# Patient Record
Sex: Female | Born: 2000 | Race: Black or African American | Hispanic: No | Marital: Single | Smoking: Never smoker
Health system: Southern US, Community
[De-identification: ages and names within clinical notes are randomized; demographics above are authoritative.]

## PROBLEM LIST (undated history)

## (undated) DIAGNOSIS — R5383 Other fatigue: Secondary | ICD-10-CM

## (undated) DIAGNOSIS — I10 Essential (primary) hypertension: Secondary | ICD-10-CM

## (undated) DIAGNOSIS — G90A Postural orthostatic tachycardia syndrome (POTS): Secondary | ICD-10-CM

## (undated) DIAGNOSIS — G43909 Migraine, unspecified, not intractable, without status migrainosus: Secondary | ICD-10-CM

## (undated) HISTORY — PX: HERNIA REPAIR: SHX51

## (undated) HISTORY — PX: TONSILLECTOMY: SUR1361

---

## 2020-01-12 ENCOUNTER — Other Ambulatory Visit: Payer: Self-pay

## 2020-01-13 ENCOUNTER — Other Ambulatory Visit: Payer: Self-pay

## 2020-08-31 ENCOUNTER — Emergency Department (HOSPITAL_COMMUNITY)
Admission: EM | Admit: 2020-08-31 | Discharge: 2020-08-31 | Disposition: A | Discharge status: Home / Self Care | Payer: 59 | Attending: Emergency Medicine | Admitting: Emergency Medicine

## 2020-08-31 ENCOUNTER — Emergency Department (HOSPITAL_COMMUNITY): Payer: 59

## 2020-08-31 ENCOUNTER — Encounter (HOSPITAL_COMMUNITY): Payer: Self-pay | Admitting: Emergency Medicine

## 2020-08-31 DIAGNOSIS — Y92410 Unspecified street and highway as the place of occurrence of the external cause: Secondary | ICD-10-CM | POA: Diagnosis not present

## 2020-08-31 DIAGNOSIS — S0990XA Unspecified injury of head, initial encounter: Secondary | ICD-10-CM | POA: Diagnosis present

## 2020-08-31 DIAGNOSIS — S0081XA Abrasion of other part of head, initial encounter: Secondary | ICD-10-CM | POA: Insufficient documentation

## 2020-08-31 MED ORDER — METHOCARBAMOL 500 MG PO TABS: 500.0000 mg | ORAL_TABLET | Freq: Two times a day (BID) | ORAL | 0 refills | Status: AC | PRN
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
## 2021-02-23 ENCOUNTER — Emergency Department (HOSPITAL_COMMUNITY)
Admission: EM | Admit: 2021-02-23 | Discharge: 2021-02-23 | Disposition: A | Discharge status: Home / Self Care | Payer: 59 | Attending: Emergency Medicine | Admitting: Emergency Medicine

## 2021-02-23 DIAGNOSIS — R531 Weakness: Secondary | ICD-10-CM | POA: Diagnosis present

## 2021-02-23 DIAGNOSIS — Z5321 Procedure and treatment not carried out due to patient leaving prior to being seen by health care provider: Secondary | ICD-10-CM | POA: Diagnosis not present

## 2021-02-23 DIAGNOSIS — R5383 Other fatigue: Secondary | ICD-10-CM | POA: Insufficient documentation

## 2021-02-23 DIAGNOSIS — R42 Dizziness and giddiness: Secondary | ICD-10-CM | POA: Insufficient documentation

## 2021-02-23 LAB — URINALYSIS, ROUTINE W REFLEX MICROSCOPIC
Bilirubin Urine: NEGATIVE
Glucose, UA: NEGATIVE mg/dL
Hgb urine dipstick: NEGATIVE
Ketones, ur: NEGATIVE mg/dL
Leukocytes,Ua: NEGATIVE
Nitrite: NEGATIVE
Protein, ur: NEGATIVE mg/dL
Specific Gravity, Urine: 1.025 (ref 1.005–1.030)
pH: 6 (ref 5.0–8.0)

## 2021-02-23 LAB — BASIC METABOLIC PANEL
Anion gap: 8 (ref 5–15)
BUN: 8 mg/dL (ref 6–20)
CO2: 23 mmol/L (ref 22–32)
Calcium: 9.1 mg/dL (ref 8.9–10.3)
Chloride: 107 mmol/L (ref 98–111)
Creatinine, Ser: 0.62 mg/dL (ref 0.44–1.00)
GFR, Estimated: >60 mL/min (ref >60)
Glucose, Bld: 101 mg/dL — ABNORMAL HIGH (ref 70–99)
Potassium: 3.7 mmol/L (ref 3.5–5.1)
Sodium: 138 mmol/L (ref 135–145)

## 2021-02-23 LAB — CBC
HCT: 39.2 % (ref 36.0–46.0)
Hemoglobin: 13.3 g/dL (ref 12.0–15.0)
MCH: 31.7 pg (ref 26.0–34.0)
MCHC: 33.9 g/dL (ref 30.0–36.0)
MCV: 93.3 fL (ref 80.0–100.0)
Platelets: 238 10*3/uL (ref 150–400)
RBC: 4.2 MIL/uL (ref 3.87–5.11)
RDW: 12.1 % (ref 11.5–15.5)
WBC: 6.5 10*3/uL (ref 4.0–10.5)
nRBC: 0 % (ref 0.0–0.2)

## 2021-02-23 LAB — CBG MONITORING, ED: Glucose-Capillary: 95 mg/dL (ref 70–99)

## 2021-02-23 LAB — I-STAT BETA HCG BLOOD, ED (MC, WL, AP ONLY): I-stat hCG, quantitative: <5 m[IU]/mL (ref <5)

## 2021-02-23 NOTE — ED Triage Notes (Signed)
Pt c/o consistent weakness/dizziness x2wks. Endorses hx of POTS, usually well controlled w hydration. States liquid IV works at home, but "it hasn't put a dent in it this time." Denies syncopal episodes, "I'm able to sit down when I feel faint." States she came to ED for IV infusion "to get it under control."

## 2021-02-23 NOTE — ED Notes (Signed)
Pt did not want to wait longer and left.

## 2021-02-23 NOTE — ED Provider Triage Note (Signed)
Emergency Medicine Provider Triage Evaluation Note  Nancy Shaw , a 21 y.o. female  was evaluated in triage.  Pt complains of weakness and fatigue.  History of POTS, states that same has been an ongoing issue for some time but has been acutely worsening the last 2 weeks.  States she is she is normally able to manage with liquid IV and plenty of oral hydration but nothing she tries is giving her any relief this time.  States that she has no energy and is barely able to get out of bed every day.  States that this is standard for her normal POTS flares which are normally resolved with IV fluids.  Review of Systems  Positive: Weakness, fatigue Negative: Fever, chills, chest pain, shortness of breath, nausea, vomiting  Physical Exam  BP (!) 149/80 (BP Location: Right Arm)    Pulse 96    Temp 99.3 F (37.4 C) (Oral)    Resp 20    SpO2 99%  Gen:   Awake, no distress   Resp:  Normal effort  MSK:   Moves extremities without difficulty Other:    Medical Decision Making  Medically screening exam initiated at 5:43 PM.  Appropriate orders placed.  Nancy Shaw was informed that the remainder of the evaluation will be completed by another provider, this initial triage assessment does not replace that evaluation, and the importance of remaining in the ED until their evaluation is complete.     Silva Bandy, PA-C 02/23/21 1745

## 2021-03-31 ENCOUNTER — Encounter (HOSPITAL_COMMUNITY): Payer: Self-pay

## 2021-03-31 ENCOUNTER — Ambulatory Visit (HOSPITAL_COMMUNITY)
Admission: EM | Admit: 2021-03-31 | Discharge: 2021-03-31 | Disposition: A | Discharge status: Home / Self Care | Payer: 59 | Attending: Internal Medicine | Admitting: Internal Medicine

## 2021-03-31 DIAGNOSIS — R590 Localized enlarged lymph nodes: Secondary | ICD-10-CM | POA: Diagnosis present

## 2021-03-31 DIAGNOSIS — H65191 Other acute nonsuppurative otitis media, right ear: Secondary | ICD-10-CM

## 2021-03-31 LAB — POCT RAPID STREP A, ED / UC: Streptococcus, Group A Screen (Direct): NEGATIVE

## 2021-03-31 LAB — POCT INFECTIOUS MONO SCREEN, ED / UC: Mono Screen: NEGATIVE

## 2021-03-31 MED ORDER — AMOXICILLIN 875 MG PO TABS: 875.0000 mg | ORAL_TABLET | Freq: Two times a day (BID) | ORAL | 0 refills | Status: DC

## 2021-03-31 NOTE — ED Provider Notes (Signed)
?[facility] ? ? ? ?CSN: [id number] ?Arrival date & time: 03/31/21  1731 ? ? ?  ? ?History   ?Chief Complaint ?No chief complaint on file. ? ? ?HPI ?Nancy Shaw is a 21 y.o. female.  ? ?Patient presents with lump to right side of neck that she noticed today.  Denies any pain to the area.  Denies any associated upper respiratory symptoms, sore throat, ear pain, fever.  Denies any known sick contacts.  Denies any injury to the area.  Patient has full range of motion of neck.  Denies any drainage from the lesion. ? ? ? ?History reviewed. No pertinent past medical history. ? ?There are no problems to display for this patient. ? ? ?History reviewed. No pertinent surgical history. ? ?OB History   ?No obstetric history on file. ?  ? ? ? ?Home Medications   ? ?Prior to Admission medications   ?Medication Sig Start Date End Date Taking? Authorizing Provider  ?amoxicillin (AMOXIL) 875 MG tablet Take 1 tablet (875 mg total) by mouth 2 (two) times daily for 10 days. 03/31/21 04/10/21 Yes Gustavus Bryant, FNP  ?methocarbamol (ROBAXIN) 500 MG tablet Take 1 tablet (500 mg total) by mouth 2 (two) times daily as needed for muscle spasms. 08/31/20   Farrel Gordon, PA-C  ? ? ?Family History ?History reviewed. No pertinent family history. ? ?Social History ?Social History  ? ?Tobacco Use  ? Smoking status: Never  ? Smokeless tobacco: Never  ? ? ? ?Allergies   ?Patient has no allergy information on record. ? ? ?Review of Systems ?Review of Systems ?Per HPI ? ?Physical Exam ?Triage Vital Signs ?ED Triage Vitals [03/31/21 1828]  ?Enc Vitals Group  ?   BP 127/76  ?   Pulse Rate 95  ?   Resp 16  ?   Temp 98 ?F (36.7 ?C)  ?   Temp Source Oral  ?   SpO2 99 %  ?   Weight   ?   Height   ?   Head Circumference   ?   Peak Flow   ?   Pain Score   ?   Pain Loc   ?   Pain Edu?   ?   Excl. in GC?   ? ?No data found. ? ?Updated Vital Signs ?BP 127/76 (BP Location: Left Arm)   Pulse 95   Temp 98 ?F (36.7 ?C) (Oral)   Resp 16   SpO2 99%   ? ?Visual Acuity ?Right Eye Distance:   ?Left Eye Distance:   ?Bilateral Distance:   ? ?Right Eye Near:   ?Left Eye Near:    ?Bilateral Near:    ? ?Physical Exam ?Constitutional:   ?   General: She is not in acute distress. ?   Appearance: Normal appearance. She is not toxic-appearing or diaphoretic.  ?HENT:  ?   Head: Normocephalic and atraumatic.  ?   Right Ear: Ear canal and external ear normal. No drainage, swelling or tenderness. No middle ear effusion. No mastoid tenderness. Tympanic membrane is erythematous. Tympanic membrane is not perforated or bulging.  ?   Left Ear: Tympanic membrane and ear canal normal.  ?   Nose: Nose normal.  ?   Mouth/Throat:  ?   Mouth: Mucous membranes are moist.  ?   Pharynx: No posterior oropharyngeal erythema.  ?Eyes:  ?   Extraocular Movements: Extraocular movements intact.  ?   Conjunctiva/sclera: Conjunctivae normal.  ?Cardiovascular:  ?   Rate  and Rhythm: Normal rate and regular rhythm.  ?   Pulses: Normal pulses.  ?   Heart sounds: Normal heart sounds.  ?Pulmonary:  ?   Effort: Pulmonary effort is normal. No respiratory distress.  ?   Breath sounds: Normal breath sounds.  ?Lymphadenopathy:  ?   Cervical: Cervical adenopathy present.  ?   Right cervical: Superficial cervical adenopathy present.  ?Skin: ?   General: Skin is warm and dry.  ?Neurological:  ?   General: No focal deficit present.  ?   Mental Status: She is alert and oriented to person, place, and time. Mental status is at baseline.  ?Psychiatric:     ?   Mood and Affect: Mood normal.     ?   Behavior: Behavior normal.     ?   Thought Content: Thought content normal.     ?   Judgment: Judgment normal.  ? ? ? ?UC Treatments / Results  ?Labs ?(all labs ordered are listed, but only abnormal results are displayed) ?Labs Reviewed  ?CULTURE, GROUP A STREP [facility])  ?POCT RAPID STREP A, ED / UC  ?POCT INFECTIOUS MONO SCREEN, ED / UC  ? ? ?EKG ? ? ?Radiology ?No results found. ? ?Procedures ?Procedures (including critical  care time) ? ?Medications Ordered in UC ?Medications - No data to display ? ?Initial Impression / Assessment and Plan / UC Course  ?I have reviewed the triage vital signs and the nursing notes. ? ?Pertinent labs & imaging results that were available during my care of the patient were reviewed by me and considered in my medical decision making (see chart for details). ? ?  ? ?Patient has right otitis media which is most likely cause of patient's cervical lymph node swelling.  Rapid strep was negative.  Rapid mono was negative.  Throat culture is pending.  Amoxicillin to treat right ear infection.  Patient to monitor and follow-up if symptoms persist or worsen.  Discussed return precautions.  Patient verbalized understanding and was agreeable with plan. ?Final Clinical Impressions(s) / UC Diagnoses  ? ?Final diagnoses:  ?Other non-recurrent acute nonsuppurative otitis media of right ear  ?Cervical lymphadenopathy  ? ? ? ?Discharge Instructions   ? ?  ?Your rapid strep test and rapid monotest were negative.  You have an ear infection which is most likely the cause of your lymph node swelling.  An antibiotic has been prescribed to help treat this.  Please follow-up if symptoms persist or worsen. ? ? ? ? ?ED Prescriptions   ? ? Medication Sig Dispense Auth. Provider  ? amoxicillin (AMOXIL) 875 MG tablet Take 1 tablet (875 mg total) by mouth 2 (two) times daily for 10 days. 20 tablet Ervin Knack E, [place]  ? ?  ? ?PDMP not reviewed this encounter. ?  ?Gustavus Bryant, [place] ?03/31/21 4098 ? ?

## 2021-03-31 NOTE — Discharge Instructions (Signed)
Your rapid strep test and rapid monotest were negative.  You have an ear infection which is most likely the cause of your lymph node swelling.  An antibiotic has been prescribed to help treat this.  Please follow-up if symptoms persist or worsen. ?

## 2021-03-31 NOTE — ED Triage Notes (Signed)
Pt presents to the office for left neck bump. Pt states she woke up this morning and notice the bump. Pt does not report flu-like symptom. ?

## 2021-04-03 ENCOUNTER — Encounter (HOSPITAL_COMMUNITY): Payer: Self-pay

## 2021-04-03 ENCOUNTER — Ambulatory Visit (HOSPITAL_COMMUNITY)
Admission: EM | Admit: 2021-04-03 | Discharge: 2021-04-03 | Disposition: A | Discharge status: Home / Self Care | Payer: 59 | Attending: Emergency Medicine | Admitting: Emergency Medicine

## 2021-04-03 DIAGNOSIS — J029 Acute pharyngitis, unspecified: Secondary | ICD-10-CM | POA: Insufficient documentation

## 2021-04-03 DIAGNOSIS — R590 Localized enlarged lymph nodes: Secondary | ICD-10-CM | POA: Insufficient documentation

## 2021-04-03 DIAGNOSIS — R519 Headache, unspecified: Secondary | ICD-10-CM | POA: Insufficient documentation

## 2021-04-03 DIAGNOSIS — R5381 Other malaise: Secondary | ICD-10-CM | POA: Diagnosis not present

## 2021-04-03 DIAGNOSIS — H66001 Acute suppurative otitis media without spontaneous rupture of ear drum, right ear: Secondary | ICD-10-CM | POA: Insufficient documentation

## 2021-04-03 LAB — CBC WITH DIFFERENTIAL/PLATELET
Abs Immature Granulocytes: 0.06 10*3/uL (ref 0.00–0.07)
Basophils Absolute: 0 10*3/uL (ref 0.0–0.1)
Basophils Relative: 0 %
Eosinophils Absolute: 0.1 10*3/uL (ref 0.0–0.5)
Eosinophils Relative: 1 %
HCT: 39.6 % (ref 36.0–46.0)
Hemoglobin: 13.6 g/dL (ref 12.0–15.0)
Immature Granulocytes: 1 %
Lymphocytes Relative: 25 %
Lymphs Abs: 1.8 10*3/uL (ref 0.7–4.0)
MCH: 32.2 pg (ref 26.0–34.0)
MCHC: 34.3 g/dL (ref 30.0–36.0)
MCV: 93.6 fL (ref 80.0–100.0)
Monocytes Absolute: 0.5 10*3/uL (ref 0.1–1.0)
Monocytes Relative: 6 %
Neutro Abs: 4.8 10*3/uL (ref 1.7–7.7)
Neutrophils Relative %: 67 %
Platelets: 236 10*3/uL (ref 150–400)
RBC: 4.23 MIL/uL (ref 3.87–5.11)
RDW: 11.9 % (ref 11.5–15.5)
WBC: 7.2 10*3/uL (ref 4.0–10.5)
nRBC: 0 % (ref 0.0–0.2)

## 2021-04-03 LAB — CULTURE, GROUP A STREP (THRC)

## 2021-04-03 MED ORDER — CEFDINIR 300 MG PO CAPS
300.0000 mg | ORAL_CAPSULE | Freq: Two times a day (BID) | ORAL | 0 refills | Status: AC
Start: 2021-04-03 — End: 2021-04-13

## 2021-04-03 NOTE — Discharge Instructions (Signed)
For the infection in your right ear and enlarged lymph nodes, please discontinue amoxicillin and begin taking Omnicef.  Please take 1 capsule twice daily for the next 10 days. ? ?To explore other possible underlying causes for your enlarged lymph nodes, we obtained a complete blood cell count which looks at all of your red blood cells and all of your white blood cells for any signs of abnormal numbers are abnormal shapes and sizes.  We also obtained a more specific monotest: Epstein-Barr viral IgG/IgM.  The CBC results should be available in the next 24 to 48 hours however the second test could take up to 5 days.  We will notify you of all the results once they are complete. ? ?Thank you for returning to urgent care today for repeat evaluation.  In the next 3 to 5 days, if you have not had improvement of your symptoms or feel your symptoms are getting worse, I encourage you to consider going to the emergency room for further, more emergent evaluation. ?

## 2021-04-03 NOTE — ED Triage Notes (Signed)
Pt presents with increased HA, lumps under bilateral axilla, and c/o sunken eyes after last visit ?

## 2021-04-03 NOTE — ED Provider Notes (Signed)
?[facility] ? ? ? ?CSN: [id number] ?Arrival date & time: 04/03/21  1935 ?  ? ?HISTORY  ? ?Chief Complaint  ?Patient presents with  ? Headache  ? Mass  ?  Bilateral axilla  ? ?HPI ?Nancy Shaw is a 21 y.o. female. Patient presents to urgent care today for follow-up of lump on the right side of her neck and infection in her right ear.  Patient was seen here at urgent care on March 24 for the symptoms, was started on amoxicillin.  Patient states since she began amoxicillin she has begun to have lymph node swelling under both of her arms, has noticed increase in fatigue, darkening around her eyes and increase intensity and frequency of the headaches.  Patient states the lumps under her arms the fatigue, the darkening around her eyes and the headaches are new.  Patient also states that when she began taking amoxicillin she had a bright red patch that was itchy on her back which went away several hours after it appeared.  Patient states she is also noticed that her throat is become more scratchy.  Patient denies vision changes, dizziness, chest pain, shortness of breath. ? ?The history is provided by the patient.  ?History reviewed. No pertinent past medical history. ?There are no problems to display for this patient. ? ?History reviewed. No pertinent surgical history. ?OB History   ?No obstetric history on file. ?  ? ?Home Medications   ? ?Prior to Admission medications   ?Medication Sig Start Date End Date Taking? Authorizing Provider  ?cefdinir (OMNICEF) 300 MG capsule Take 1 capsule (300 mg total) by mouth 2 (two) times daily for 10 days. 04/03/21 04/13/21 Yes Theadora RamaMorgan, Keyah Blizard Scales, PA-C  ?methocarbamol (ROBAXIN) 500 MG tablet Take 1 tablet (500 mg total) by mouth 2 (two) times daily as needed for muscle spasms. 08/31/20   Farrel GordonPatel, Shalyn, PA-C  ? ?Family History ?History reviewed. No pertinent family history. ?Social History ?Social History  ? ?Tobacco Use  ? Smoking status: Never  ? Smokeless tobacco: Never   ? ?Allergies   ?Patient has no known allergies. ? ?Review of Systems ?Review of Systems ?Pertinent findings noted in history of present illness.  ? ?Physical Exam ?Triage Vital Signs ?ED Triage Vitals  ?Enc Vitals Group  ?   BP 11/04/20 0827 (!) 147/82  ?   Pulse Rate 11/04/20 0827 72  ?   Resp 11/04/20 0827 18  ?   Temp 11/04/20 0827 98.3 ?F (36.8 ?C)  ?   Temp Source 11/04/20 0827 Oral  ?   SpO2 11/04/20 0827 98 %  ?   Weight --   ?   Height --   ?   Head Circumference --   ?   Peak Flow --   ?   Pain Score 11/04/20 0826 5  ?   Pain Loc --   ?   Pain Edu? --   ?   Excl. in GC? --   ?No data found. ? ?Updated Vital Signs ?BP 126/79 (BP Location: Right Arm)   Pulse 90   Temp 98.8 ?F (37.1 ?C) (Oral)   Resp 14   SpO2 97%  ? ?Physical Exam ?Vitals and nursing note reviewed.  ?Constitutional:   ?   General: She is not in acute distress. ?   Appearance: Normal appearance. She is not ill-appearing.  ?HENT:  ?   Head: Normocephalic and atraumatic.  ?   Salivary Glands: Right salivary gland is not diffusely enlarged or  tender. Left salivary gland is not diffusely enlarged or tender.  ?   Right Ear: Ear canal and external ear normal. No drainage. A middle ear effusion (Suppurative fluid behind right TM) is present. There is no impacted cerumen. Tympanic membrane is injected and erythematous. Tympanic membrane is not bulging.  ?   Left Ear: Tympanic membrane, ear canal and external ear normal. No drainage.  No middle ear effusion. There is no impacted cerumen. Tympanic membrane is not erythematous or bulging.  ?   Nose: Nose normal. No nasal deformity, septal deviation, mucosal edema, congestion or rhinorrhea.  ?   Right Turbinates: Not enlarged, swollen or pale.  ?   Left Turbinates: Not enlarged, swollen or pale.  ?   Right Sinus: No maxillary sinus tenderness or frontal sinus tenderness.  ?   Left Sinus: No maxillary sinus tenderness or frontal sinus tenderness.  ?   Mouth/Throat:  ?   Lips: Pink. No lesions.  ?    Mouth: Mucous membranes are moist. No oral lesions.  ?   Pharynx: Oropharynx is clear. Uvula midline. Posterior oropharyngeal erythema present. No uvula swelling.  ?   Tonsils: No tonsillar exudate. 0 on the right. 0 on the left.  ?Eyes:  ?   General: Lids are normal.     ?   Right eye: No discharge.     ?   Left eye: No discharge.  ?   Extraocular Movements: Extraocular movements intact.  ?   Conjunctiva/sclera: Conjunctivae normal.  ?   Right eye: Right conjunctiva is not injected.  ?   Left eye: Left conjunctiva is not injected.  ?Neck:  ?   Trachea: Trachea and phonation normal.  ?Cardiovascular:  ?   Rate and Rhythm: Normal rate and regular rhythm.  ?   Pulses: Normal pulses.  ?   Heart sounds: Normal heart sounds. No murmur heard. ?  No friction rub. No gallop.  ?Pulmonary:  ?   Effort: Pulmonary effort is normal. No accessory muscle usage, prolonged expiration or respiratory distress.  ?   Breath sounds: Normal breath sounds. No stridor, decreased air movement or transmitted upper airway sounds. No decreased breath sounds, wheezing, rhonchi or rales.  ?Chest:  ?   Chest wall: No tenderness.  ?Musculoskeletal:     ?   General: Normal range of motion.  ?   Cervical back: Normal range of motion and neck supple. Normal range of motion.  ?Lymphadenopathy:  ?   Head:  ?   Right side of head: Submandibular, tonsillar and posterior auricular adenopathy present. No submental, preauricular or occipital adenopathy.  ?   Left side of head: Submandibular, tonsillar and posterior auricular adenopathy present. No submental, preauricular or occipital adenopathy.  ?   Cervical: Cervical adenopathy present.  ?   Right cervical: Superficial cervical adenopathy, deep cervical adenopathy and posterior cervical adenopathy present.  ?   Left cervical: Superficial cervical adenopathy, deep cervical adenopathy and posterior cervical adenopathy present.  ?   Upper Body:  ?   Right upper body: Supraclavicular adenopathy and axillary  adenopathy present.  ?   Left upper body: Supraclavicular adenopathy and axillary adenopathy present.  ?Skin: ?   General: Skin is warm and dry.  ?   Findings: No erythema or rash.  ?Neurological:  ?   General: No focal deficit present.  ?   Mental Status: She is alert and oriented to person, place, and time.  ?Psychiatric:     ?   Mood  and Affect: Mood normal.     ?   Behavior: Behavior normal.  ? ? ?Visual Acuity ?Right Eye Distance:   ?Left Eye Distance:   ?Bilateral Distance:   ? ?Right Eye Near:   ?Left Eye Near:    ?Bilateral Near:    ? ?UC Couse / Diagnostics / Procedures:  ?  ?EKG ? ?Radiology ?No results found. ? ?Procedures ?Procedures (including critical care time) ? ?UC Diagnoses / Final Clinical Impressions(s)   ?I have reviewed the triage vital signs and the nursing notes. ? ?Pertinent labs & imaging results that were available during my care of the patient were reviewed by me and considered in my medical decision making (see chart for details).   ?Final diagnoses:  ?Lymphadenopathy, axillary  ?Cervical lymphadenopathy  ?Acute suppurative otitis media of right ear  ?Acute pharyngitis, unspecified etiology  ?Acute nonintractable headache, unspecified headache type  ?Malaise  ? ?Patient presents to urgent care with worsening lymphadenopathy after beginning amoxicillin for acute suppurative right otitis media.  Right otitis media still present on exam today.  Patient advised to discontinue amoxicillin and deference to a broader spectrum antibiotic, cefdinir.  We will obtain CBC with differential as well as an EBV viral capsid IgG IgM to completely rule out mono.  Patient advised to go to the emergency room if she experiences worsening of symptoms in the next 2 to 3 days.  Patient advised to return to urgent care for further evaluation if she is not improving. ?ED Prescriptions   ? ? Medication Sig Dispense Auth. Provider  ? cefdinir (OMNICEF) 300 MG capsule Take 1 capsule (300 mg total) by mouth 2 (two)  times daily for 10 days. 20 capsule Theadora Rama Scales, PA-C  ? ?  ? ?PDMP not reviewed this encounter. ? ?Pending results:  ?Labs Reviewed  ?CBC WITH DIFFERENTIAL/PLATELET  ?EBV AB TO VIRAL CAPSID AG PNL

## 2021-04-05 LAB — EBV AB TO VIRAL CAPSID AG PNL, IGG+IGM
EBV VCA IgG: 318 U/mL — ABNORMAL HIGH (ref 0.0–17.9)
EBV VCA IgM: <36 U/mL (ref 0.0–35.9)

## 2021-11-24 ENCOUNTER — Encounter (HOSPITAL_COMMUNITY): Payer: Self-pay

## 2021-11-24 ENCOUNTER — Other Ambulatory Visit: Payer: Self-pay

## 2021-11-24 ENCOUNTER — Emergency Department (HOSPITAL_COMMUNITY)
Admission: EM | Admit: 2021-11-24 | Discharge: 2021-11-25 | Discharge status: Against Medical Advice | Payer: 59 | Attending: Emergency Medicine | Admitting: Emergency Medicine

## 2021-11-24 DIAGNOSIS — R55 Syncope and collapse: Secondary | ICD-10-CM | POA: Insufficient documentation

## 2021-11-24 DIAGNOSIS — Z5321 Procedure and treatment not carried out due to patient leaving prior to being seen by health care provider: Secondary | ICD-10-CM | POA: Insufficient documentation

## 2021-11-24 HISTORY — DX: Postural orthostatic tachycardia syndrome (POTS): G90.A

## 2021-11-24 HISTORY — DX: Essential (primary) hypertension: I10

## 2021-11-24 HISTORY — DX: Other fatigue: R53.83

## 2021-11-24 HISTORY — DX: Migraine, unspecified, not intractable, without status migrainosus: G43.909

## 2021-11-24 LAB — CBC WITH DIFFERENTIAL/PLATELET
Abs Immature Granulocytes: 0.03 10*3/uL (ref 0.00–0.07)
Basophils Absolute: 0 10*3/uL (ref 0.0–0.1)
Basophils Relative: 0 %
Eosinophils Absolute: 0 10*3/uL (ref 0.0–0.5)
Eosinophils Relative: 0 %
HCT: 39.8 % (ref 36.0–46.0)
Hemoglobin: 13 g/dL (ref 12.0–15.0)
Immature Granulocytes: 0 %
Lymphocytes Relative: 24 %
Lymphs Abs: 2.4 10*3/uL (ref 0.7–4.0)
MCH: 31 pg (ref 26.0–34.0)
MCHC: 32.7 g/dL (ref 30.0–36.0)
MCV: 95 fL (ref 80.0–100.0)
Monocytes Absolute: 0.7 10*3/uL (ref 0.1–1.0)
Monocytes Relative: 7 %
Neutro Abs: 6.7 10*3/uL (ref 1.7–7.7)
Neutrophils Relative %: 69 %
Platelets: 272 10*3/uL (ref 150–400)
RBC: 4.19 MIL/uL (ref 3.87–5.11)
RDW: 12.7 % (ref 11.5–15.5)
WBC: 9.9 10*3/uL (ref 4.0–10.5)
nRBC: 0 % (ref 0.0–0.2)

## 2021-11-24 LAB — URINALYSIS, ROUTINE W REFLEX MICROSCOPIC
Bacteria, UA: NONE SEEN
Bilirubin Urine: NEGATIVE
Glucose, UA: NEGATIVE mg/dL
Hgb urine dipstick: NEGATIVE
Ketones, ur: NEGATIVE mg/dL
Nitrite: NEGATIVE
Protein, ur: NEGATIVE mg/dL
Specific Gravity, Urine: 1.028 (ref 1.005–1.030)
pH: 8 (ref 5.0–8.0)

## 2021-11-24 LAB — CBG MONITORING, ED: Glucose-Capillary: 85 mg/dL (ref 70–99)

## 2021-11-24 LAB — COMPREHENSIVE METABOLIC PANEL
ALT: 15 U/L (ref 0–44)
AST: 21 U/L (ref 15–41)
Albumin: 3.9 g/dL (ref 3.5–5.0)
Alkaline Phosphatase: 59 U/L (ref 38–126)
Anion gap: 6 (ref 5–15)
BUN: 12 mg/dL (ref 6–20)
CO2: 22 mmol/L (ref 22–32)
Calcium: 9 mg/dL (ref 8.9–10.3)
Chloride: 112 mmol/L — ABNORMAL HIGH (ref 98–111)
Creatinine, Ser: 0.61 mg/dL (ref 0.44–1.00)
GFR, Estimated: >60 mL/min (ref >60)
Glucose, Bld: 90 mg/dL (ref 70–99)
Potassium: 4 mmol/L (ref 3.5–5.1)
Sodium: 140 mmol/L (ref 135–145)
Total Bilirubin: 0.6 mg/dL (ref 0.3–1.2)
Total Protein: 6.9 g/dL (ref 6.5–8.1)

## 2021-11-24 LAB — I-STAT BETA HCG BLOOD, ED (MC, WL, AP ONLY): I-stat hCG, quantitative: <5 m[IU]/mL (ref <5)

## 2021-11-24 NOTE — ED Provider Triage Note (Signed)
Emergency Medicine Provider Triage Evaluation Note  Nancy Shaw , a 21 y.o. female  was evaluated in triage.  Pt complains of 2 syncopal episodes that happened earlier today.  Patient has POTS.  She states she was lying down when she experienced episodes of passing out, did not fall or sustain injury.  She states she woke up not feeling well.  Denies fever, chills, cough, nausea, vomiting, diarrhea.  She is ambulatory without difficulty.  Review of Systems  Positive: See above Negative: See above  Physical Exam  BP 125/79   Pulse 83   Temp 99.3 F (37.4 C) (Oral)   Resp 16   Ht 5\' 2"  (1.575 m)   Wt 72.6 kg   SpO2 99%   BMI 29.26 kg/m  Gen:   Awake, no distress   Resp:  Normal effort, lungs clear bilaterally MSK:   Moves extremities without difficulty, skin is warm and dry Other:  Heart rate and rhythm normal, heart sounds normal  Medical Decision Making  Medically screening exam initiated at 9:26 PM.  Appropriate orders placed.  Nancy Shaw was informed that the remainder of the evaluation will be completed by another provider, this initial triage assessment does not replace that evaluation, and the importance of remaining in the ED until their evaluation is complete.     Nancy Shaw, Nancy Shaw 11/24/21 2129

## 2021-11-24 NOTE — ED Triage Notes (Addendum)
Pt reports she has POTS and has passed out a couple of times today when she was laying down (did not fall). She states she woke up not feeling well. She states she is here to get some IV fluids. She denies pain. A&OX4, ambulatory with independent steady gait.

## 2021-11-25 NOTE — ED Notes (Signed)
Pt called x3 again for vitals recheck, still no response. Moving pt OTF.

## 2022-05-26 ENCOUNTER — Encounter (HOSPITAL_COMMUNITY): Payer: Self-pay | Admitting: *Deleted

## 2022-05-26 ENCOUNTER — Emergency Department (HOSPITAL_COMMUNITY)
Admission: EM | Admit: 2022-05-26 | Discharge: 2022-05-26 | Disposition: A | Discharge status: Home / Self Care | Payer: 59 | Attending: Emergency Medicine | Admitting: Emergency Medicine

## 2022-05-26 ENCOUNTER — Other Ambulatory Visit: Payer: Self-pay

## 2022-05-26 DIAGNOSIS — J0100 Acute maxillary sinusitis, unspecified: Secondary | ICD-10-CM | POA: Insufficient documentation

## 2022-05-26 DIAGNOSIS — J019 Acute sinusitis, unspecified: Secondary | ICD-10-CM

## 2022-05-26 DIAGNOSIS — R519 Headache, unspecified: Secondary | ICD-10-CM | POA: Diagnosis present

## 2022-05-26 MED ORDER — AMOXICILLIN-POT CLAVULANATE 875-125 MG PO TABS: 1.0000 | ORAL_TABLET | Freq: Two times a day (BID) | ORAL | 0 refills | Status: AC

## 2022-05-26 NOTE — ED Provider Notes (Signed)
[place] EM[facility] AT [facility] Provider Note   CSN: [id number] Arrival date & time: 05/26/22  1754     History Chief Complaint  Patient presents with   Facial Pain   Blurred Vision   Headache    HPI Nancy Shaw is a 22 y.o. female presenting for chief complaint of right-sided facial swelling, sinus pressure for 14 days with interval worsening over the last 24 hours.  Initially was like an upper respiratory infection with bilateral rhinorrhea but the right-sided sinus pressure and swelling only started in the last 48 hours.  Is been progressive in nature.  Denies any history of similar.  Denies fevers chills nausea vomiting syncope shortness of breath but otherwise ambulatory tolerating p.o. intake..   Patient's recorded medical, surgical, social, medication list and allergies were reviewed in the Snapshot window as part of the initial history.   Review of Systems   Review of Systems  Constitutional:  Negative for chills and fever.  HENT:  Positive for congestion, facial swelling and sinus pain. Negative for ear pain and sore throat.   Eyes:  Negative for pain and visual disturbance.  Respiratory:  Negative for cough and shortness of breath.   Cardiovascular:  Negative for chest pain and palpitations.  Gastrointestinal:  Negative for abdominal pain and vomiting.  Genitourinary:  Negative for dysuria and hematuria.  Musculoskeletal:  Negative for arthralgias and back pain.  Skin:  Negative for color change and rash.  Neurological:  Negative for seizures and syncope.  All other systems reviewed and are negative.   Physical Exam Updated Vital Signs BP 130/71 (BP Location: Left Arm)   Pulse 100   Temp 98.6 F (37 C) (Oral)   Resp 16   Ht 5\' 2"  (1.575 m)   Wt 59 kg   SpO2 100%   BMI 23.78 kg/m  Physical Exam Vitals and nursing note reviewed.  Constitutional:      General: She is not in acute distress.    Appearance: She is well-developed.   HENT:     Head: Normocephalic and atraumatic.  Eyes:     Conjunctiva/sclera: Conjunctivae normal.  Cardiovascular:     Rate and Rhythm: Normal rate and regular rhythm.     Heart sounds: No murmur heard. Pulmonary:     Effort: Pulmonary effort is normal. No respiratory distress.     Breath sounds: Normal breath sounds.  Abdominal:     General: There is no distension.     Palpations: Abdomen is soft.     Tenderness: There is no abdominal tenderness. There is no right CVA tenderness or left CVA tenderness.  Musculoskeletal:        General: No swelling or tenderness. Normal range of motion.     Cervical back: Neck supple.     Comments: Substantial tenderness to palpation over the right maxillary sinus.  Soft tissue swelling of the right maxilla radiating into the right lower orbit.  Skin:    General: Skin is warm and dry.  Neurological:     General: No focal deficit present.     Mental Status: She is alert and oriented to person, place, and time. Mental status is at baseline.     Cranial Nerves: No cranial nerve deficit.      ED Course/ Medical Decision Making/ A&P    Procedures Procedures   Medications Ordered in ED Medications - No data to display  Medical Decision Making:    Nancy Shaw is a 22 y.o. female  who presented to the ED today with facial pain and swelling detailed above.     Complete initial physical exam performed, notably the patient  was hemodynamically stable in no acute distress.      Reviewed and confirmed nursing documentation for past medical history, family history, social history.    Initial Assessment:   With the patient's presentation of facial pain and swelling, most likely diagnosis is sinusitis. Other diagnoses were considered including (but not limited to) preseptal cellulitis, orbital cellulitis. These are considered less likely due to history of present illness and physical exam findings.   This is most consistent with an acute life/limb  threatening illness complicated by underlying chronic conditions. In particular, lack of any pain with orbital motion or visual disturbances makes orbital cellulitis seem grossly less consistent. Initial Plan:  Given bimodal description of illness, duration of illness, localization of symptoms to a single sinus, will treat for bacterial sinusitis with Augmentin twice daily x 10 days recommend close follow-up with primary care provider for interval resolution within 48 hours.  Disposition:  I have considered need for hospitalization, however, considering all of the above, I believe this patient is stable for discharge at this time.  Patient/family educated about specific return precautions for given chief complaint and symptoms.  Patient/family educated about follow-up with PCP.     Patient/family expressed understanding of return precautions and need for follow-up. Patient spoken to regarding all imaging and laboratory results and appropriate follow up for these results. All education provided in verbal form with additional information in written form. Time was allowed for answering of patient questions. Patient discharged.    Emergency Department Medication Summary:   Medications - No data to display       Clinical Impression:  1. Acute non-recurrent sinusitis, unspecified location      Discharge   Final Clinical Impression(s) / ED Diagnoses Final diagnoses:  Acute non-recurrent sinusitis, unspecified location    Rx / DC Orders ED Discharge Orders          Ordered    amoxicillin-clavulanate (AUGMENTIN) 875-125 MG tablet  2 times daily        05/26/22 1916              Glyn Ade, MD 05/26/22 1919

## 2022-05-26 NOTE — ED Triage Notes (Signed)
Blurry vision and headache today with sinus pressure today feels different

## 2022-06-10 ENCOUNTER — Encounter (HOSPITAL_COMMUNITY): Payer: Self-pay

## 2022-06-10 ENCOUNTER — Ambulatory Visit (HOSPITAL_COMMUNITY)
Admission: EM | Admit: 2022-06-10 | Discharge: 2022-06-10 | Disposition: A | Discharge status: Home / Self Care | Payer: 59 | Attending: Physician Assistant | Admitting: Physician Assistant

## 2022-06-10 DIAGNOSIS — N898 Other specified noninflammatory disorders of vagina: Secondary | ICD-10-CM | POA: Insufficient documentation

## 2022-06-10 DIAGNOSIS — H66001 Acute suppurative otitis media without spontaneous rupture of ear drum, right ear: Secondary | ICD-10-CM

## 2022-06-10 DIAGNOSIS — B379 Candidiasis, unspecified: Secondary | ICD-10-CM

## 2022-06-10 DIAGNOSIS — T3695XA Adverse effect of unspecified systemic antibiotic, initial encounter: Secondary | ICD-10-CM | POA: Insufficient documentation

## 2022-06-10 MED ORDER — FLUCONAZOLE 150 MG PO TABS: 150.0000 mg | ORAL_TABLET | ORAL | 0 refills | Status: AC

## 2022-06-10 MED ORDER — CEFDINIR 300 MG PO CAPS: 300.0000 mg | ORAL_CAPSULE | Freq: Two times a day (BID) | ORAL | 0 refills | Status: AC

## 2022-06-10 NOTE — Discharge Instructions (Signed)
Start cefdinir twice daily for 7 days.  Continue allergy medications including cetirizine and nasal spray such as fluticasone which is available over-the-counter.  I also recommend nasal saline and sinus rinses.  If you have any significant nausea or vomiting with this medication please return for reevaluation.  Take fluconazole for yeast infection symptoms.  I have called in a few other doses that you can take once a week if you have persistent or recurrent symptoms with antibiotic use.  If your symptoms not improving or if anything worsens you need to return for reevaluation.

## 2022-06-10 NOTE — ED Triage Notes (Signed)
Pt presents to the office with right ear pain and nasal congestion. Pt states the right side of her throat is sore. Pt stated she was on antibiotic but stop taking them due to the side effects.  Pt is taking Mucin ex.

## 2022-06-10 NOTE — ED Provider Notes (Signed)
[facility]    CSN: [id number] Arrival date & time: 06/10/22  1035      History   Chief Complaint Chief Complaint  Patient presents with   Otalgia   Sore Throat    HPI Nancy Shaw is a 22 y.o. female.   Patient presents today with a several day history of sore throat and otalgia.  Several weeks ago she was seen in the emergency room for a severe sinus infection and started on Augmentin.  She was only able to take it for 5 days because she had significant nausea and vomiting with this medication.  She did have an improvement of sinus symptoms but continued to have some mild congestion and cough but then has also developed otalgia and sore throat.  She reports that pain is rated 4/5 on a 0-10 pain scale, described as aching, no aggravating leaving factors notified.  This is primarily on the left side but she reports that her sinus infection was worse on the right.  Does have allergies and has been taking Zyrtec without improvement.  Has also tried Mucinex without improvement of symptoms.  She does report she is developed some vaginal irritation related to antibiotic use and is requesting medication for this.  She is anxious to feel better she is scheduled to go to [place] in a few days.    Past Medical History:  Diagnosis Date   Fatigue    Hypertension    Migraines    POTS (postural orthostatic tachycardia syndrome)     There are no problems to display for this patient.   Past Surgical History:  Procedure Laterality Date   HERNIA REPAIR     TONSILLECTOMY      OB History   No obstetric history on file.      Home Medications    Prior to Admission medications   Medication Sig Start Date End Date Taking? Authorizing Provider  cefdinir (OMNICEF) 300 MG capsule Take 1 capsule (300 mg total) by mouth 2 (two) times daily. 06/10/22  Yes Anthonymichael Munday K, PA-C  fluconazole (DIFLUCAN) 150 MG tablet Take 1 tablet (150 mg total) by mouth once a week for 3 doses. 06/10/22  06/25/22 Yes Meena Barrantes, Noberto Retort, PA-C  cetirizine (ZYRTEC) 10 MG tablet Take 10 mg by mouth daily.    [provider]  medroxyPROGESTERone (DEPO-PROVERA) 150 MG/ML injection Inject 150 mg into the muscle every 3 (three) months.    [provider]  methocarbamol (ROBAXIN) 500 MG tablet Take 1 tablet (500 mg total) by mouth 2 (two) times daily as needed for muscle spasms. Patient not taking: Reported on 05/26/2022 08/31/20   Farrel Gordon, PA-C  Pseudoephedrine-APAP-DM (DAYQUIL MULTI-SYMPTOM COLD/FLU PO) Take 1 capsule by mouth daily as needed (sinus).    [provider]    Family History History reviewed. No pertinent family history.  Social History Social History   Tobacco Use   Smoking status: Never   Smokeless tobacco: Never     Allergies   Patient has no known allergies.   Review of Systems Review of Systems  Constitutional:  Positive for activity change. Negative for appetite change, fatigue and fever.  HENT:  Positive for ear pain and sore throat. Negative for congestion, sinus pressure and sneezing.   Respiratory:  Positive for cough. Negative for shortness of breath.   Cardiovascular:  Negative for chest pain.  Gastrointestinal:  Negative for abdominal pain, diarrhea, nausea and vomiting.  Genitourinary:  Positive for vaginal pain (irritation). Negative for  pelvic pain, vaginal bleeding and vaginal discharge.     Physical Exam Triage Vital Signs ED Triage Vitals [06/10/22 1215]  Enc Vitals Group     BP 134/86     Pulse Rate 82     Resp 16     Temp 98.9 F (37.2 C)     Temp Source Oral     SpO2 97 %     Weight      Height      Head Circumference      Peak Flow      Pain Score      Pain Loc      Pain Edu?      Excl. in GC?    No data found.  Updated Vital Signs BP 134/86 (BP Location: Left Arm)   Pulse 82   Temp 98.9 F (37.2 C) (Oral)   Resp 16   SpO2 97%   Visual Acuity Right Eye Distance:   Left Eye Distance:   Bilateral  Distance:    Right Eye Near:   Left Eye Near:    Bilateral Near:     Physical Exam Vitals reviewed.  Constitutional:      General: She is awake. She is not in acute distress.    Appearance: Normal appearance. She is well-developed. She is not ill-appearing.     Comments: Very pleasant female appears stated age in no acute distress sitting comfortably in exam room  HENT:     Head: Normocephalic and atraumatic.     Right Ear: Ear canal and external ear normal. Tympanic membrane is erythematous and bulging.     Left Ear: Tympanic membrane, ear canal and external ear normal. Tympanic membrane is not erythematous or bulging.     Nose:     Right Sinus: No maxillary sinus tenderness or frontal sinus tenderness.     Left Sinus: No maxillary sinus tenderness or frontal sinus tenderness.     Mouth/Throat:     Pharynx: Uvula midline. No oropharyngeal exudate or posterior oropharyngeal erythema.  Cardiovascular:     Rate and Rhythm: Normal rate and regular rhythm.     Heart sounds: Normal heart sounds, S1 normal and S2 normal. No murmur heard. Pulmonary:     Effort: Pulmonary effort is normal.     Breath sounds: Normal breath sounds. No wheezing, rhonchi or rales.     Comments: Clear to auscultation bilaterally Psychiatric:        Behavior: Behavior is cooperative.      UC Treatments / Results  Labs (all labs ordered are listed, but only abnormal results are displayed) Labs Reviewed  CERVICOVAGINAL ANCILLARY ONLY    EKG   Radiology No results found.  Procedures Procedures (including critical care time)  Medications Ordered in UC Medications - No data to display  Initial Impression / Assessment and Plan / UC Course  I have reviewed the triage vital signs and the nursing notes.  Pertinent labs & imaging results that were available during my care of the patient were reviewed by me and considered in my medical decision making (see chart for details).     Patient is  well-appearing, afebrile, nontoxic, nontachycardic.  Physical exam revealed otitis media on right which I suspect is related to sinus infection that she was unable to complete treatment for.  Interestingly, her left side does not appear to be infected.  She was started on Omnicef given intolerance of Augmentin.  I suspect that her vaginal irritation is related to antibiotic  induced yeast infection and so was given several doses of fluconazole to treat current symptoms as well as to have on hand in case she develops recurrent symptoms following additional antibiotics.  Recommend that she continue over-the-counter medication including Mucinex, Flonase, Tylenol.  She is also to continue allergy medication as previously prescribed.  Recommended rest and drinking plenty of fluids.  Discussed that if she has any worsening or changing symptoms she needs to be seen immediately.  Strict return precautions given.  Final Clinical Impressions(s) / UC Diagnoses   Final diagnoses:  Non-recurrent acute suppurative otitis media of right ear without spontaneous rupture of tympanic membrane  Antibiotic-induced yeast infection  Vaginal irritation     Discharge Instructions      Start cefdinir twice daily for 7 days.  Continue allergy medications including cetirizine and nasal spray such as fluticasone which is available over-the-counter.  I also recommend nasal saline and sinus rinses.  If you have any significant nausea or vomiting with this medication please return for reevaluation.  Take fluconazole for yeast infection symptoms.  I have called in a few other doses that you can take once a week if you have persistent or recurrent symptoms with antibiotic use.  If your symptoms not improving or if anything worsens you need to return for reevaluation.     ED Prescriptions     Medication Sig Dispense Auth. Provider   cefdinir (OMNICEF) 300 MG capsule Take 1 capsule (300 mg total) by mouth 2 (two) times daily. 14  capsule Lesette Frary K, PA-C   fluconazole (DIFLUCAN) 150 MG tablet Take 1 tablet (150 mg total) by mouth once a week for 3 doses. 3 tablet Shameek Nyquist, Noberto Retort, PA-C      PDMP not reviewed this encounter.   Jeani Hawking, PA-C 06/10/22 1241

## 2022-06-11 LAB — CERVICOVAGINAL ANCILLARY ONLY
Bacterial Vaginitis (gardnerella): POSITIVE — AB
Candida Glabrata: NEGATIVE
Candida Vaginitis: POSITIVE — AB
Chlamydia: NEGATIVE
Comment: NEGATIVE
Comment: NEGATIVE
Comment: NEGATIVE
Comment: NEGATIVE
Comment: NEGATIVE
Comment: NORMAL
Neisseria Gonorrhea: NEGATIVE
Trichomonas: NEGATIVE

## 2022-06-12 ENCOUNTER — Telehealth: Payer: Self-pay | Admitting: Emergency Medicine

## 2022-06-12 MED ORDER — METRONIDAZOLE 500 MG PO TABS: 500.0000 mg | ORAL_TABLET | Freq: Two times a day (BID) | ORAL | 0 refills | Status: AC
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
## 2023-03-20 NOTE — Progress Notes (Signed)
 Nancy Shaw is a 23 y.o.  with  reports that she has never smoked. She has never used smokeless tobacco. with  Active Ambulatory Problems    Diagnosis Date Noted  . No Active Ambulatory Problems   Resolved Ambulatory Problems    Diagnosis Date Noted  . No Resolved Ambulatory Problems   Past Medical History:  Diagnosis Date  . Body mass index, pediatric, 5th percentile to less than 85th percentile for age   . Coronavirus infection Resolved: [id number]  . Influenza   . Routine infant or child health check   . Uses contraception    who presents today at Urgent Care for  Chief Complaint  Patient presents with  . Mass    Patient states she has a bump on her back that has become red, itchy, and painful. Patient states she noticed the spot two days ago.        History of Present Illness The patient is a 23 year old female who presents today with a bump on her back that has become red, itchy, and painful.  She first noticed the bump 2 days ago. It has since increased in size and become painful. The area is warm to touch. She does not recall any specific outdoor activities or finding a tick. Additionally, she has not observed any spiders recently.      Patient has no co-morbidities  or medications that might increase the risk for developing a severe infection     reports that she has never smoked. She has never used smokeless tobacco.  Review of Systems  Review of systems is otherwise negative except as noted in the HPI and Assessment/MDM   Physical Exam  BP 122/74 (BP Location: Left arm, Patient Position: Sitting)   Pulse 78   Temp 98.4 F (36.9 C) (Tympanic)   Resp 18   Ht 1.575 m (5' 2)   Wt 71.7 kg (158 lb)   LMP  (LMP Unknown)   SpO2 100%   BMI 28.90 kg/m   Constitutional:      General: Patient is not in acute distress.    Appearance: Normal appearance.  Neurological:     General: No focal deficit present.     Mental Status: alert and oriented to  person, place, and time.  HENT:     Eyes:     Conjunctiva/sclera: Conjunctivae normal. Pharynx normal    There is no associated anterior cervical lymphadenopathy    Pupils: Pupils are equal, round, and reactive to light.     TM's normal with no visible effusion  Cardiovascular:     Heart sounds: Normal heart sounds. No murmur heard.    No Lower extremity edema noted Pulmonary:     Effort: Pulmonary effort is normal. No respiratory distress.     Breath sounds: No wheezing, rhonchi or rales.  Abdominal:     General: Bowel sounds are normal. There is no distension.     Tenderness: There is no abdominal tenderness.  Musculoskeletal:        General: Normal range of motion.     Neck:  No rigidity.  Skin:    General: Area of induration and erythema surrounding, no fluctuance Psychiatric:        Mood and Affect: Mood normal.   No orders to display            DIAGNOSIS/PLAN     1. Insect bite of right back wall of thorax, initial encounter  clindamycin (CLEOCIN) 300 mg capsule  mupirocin (BACTROBAN) 2 % ointment      Assessment & Plan 1. Abscess from an insect bite. Conservative treatments were discussed. An antibiotic will be prescribed, and mupirocin cream is to be applied twice daily. If the abscess worsens, increases in size, or changes, she needs to follow up for reevaluation.    We discussed risks and side effects of medications, and also discussed red flags which would warrant immediate follow-up.   Urgent Care Disposition:  Home Care

## 2023-05-11 NOTE — Progress Notes (Signed)
 [address] - AMBULATORY [facility] [address] [place] [address]   Date of Service: 05/11/2023 Patient DOB: 2000-11-13    History of Present Illness   Patient ID: Nancy Shaw is a 23 y.o. female. Patient presents to urgent care due to skin lesions of back.  Patient states that she was urgent care approximately 1 to 2 months ago for similar symptoms in which she was treated for an insect bite at that time.  Patient states that symptoms did not improve but has noticed a residual dark discoloration.  Does note some associated itching at times.  Denies any new products or contact irritants. Denies hx of psoriasis or eczema. Has not been using any topical OTC therapies at this time.  Denies fever, redness, warmth, swelling, pain, discharge from the affected areas.  Past Medical History:  Diagnosis Date  . Body mass index, pediatric, 5th percentile to less than 85th percentile for age    BMI,pediatric 5% - <85%  . Coronavirus infection Resolved: [id number]   Coronavirus infection  . Influenza    INFLUENZA  . Routine infant or child health check    ROUTINE INFANT OR CHILD HEALTH CHECK  . Uses contraception    Family planning    BP 105/72 (BP Location: Right arm, Patient Position: Sitting)   Pulse 78   Wt 76.7 kg (169 lb)   BMI 30.91 kg/m    Review of Systems   Review of Systems  Skin:  Positive for rash.     Physicial Exam   Physical Exam Vitals reviewed.  Constitutional:      Appearance: Normal appearance. She is normal weight.  Skin:    Findings: Lesion present.          Comments: 2 annular approximately 4 cm and 3 cm hyperpigmented patches of right mid back present without scaling, erythema, pain, warmth, drainage noted.   Neurological:     Mental Status: She is alert.      Diagnosis   Hermie was seen today for rash.  Diagnoses and all orders for this  visit:  Skin lesion -     Ambulatory Referral to Dermatology; Future     Medical Decision Making Earlyn Idy Rawling is a 23 y.o. female who presents to urgent care today with complaints of  Chief Complaint  Patient presents with  . Rash    Patient reports that she was seen here about 1 month ago and was diagnosed with spider bite. States that since then she has noticed that area continues to itch and had darken area.     On exam, VS stable and patient is afebrile. Appears well-hydrated and capillary refill <2 seconds.  Differential diagnosis includes: psoriasis, allergic reaction, contact dermatitis, atopic dermatitis, hand, foot mouth, chicken pox, erysipelas, impetigo  Patient presents to urgent care due to skin lesions of back. Pt does appear to have some hyperpigmented patches  without overlying cellulitis or signs of infection. Recommend OTC hydrocortisone and to keep the area moisturized. Referral to derm provided if no improvement. Red flag sx and return precautions provided.  Discharge Information  Symptomatic management discussed.  Patient was given verbal and written instructions on symptoms that necessitate return to the UC/ED, and instructed to f/u w/ UC or PCP if not improving in expected timeframe.   Patient/parent has been instructed on RX/OTC medications, dosages, side effects, and possible interactions as associated with each diagnosis in my  impression and plan above.   Patient education (verbal/handout) given on diagnosis, pathophysiology, treatment of diagnosis, side effects of medication use for treatment, restrictions while taking medication, supportives measures such as staying hydrated.   Red Flags associated with diagnosis/es were reviewed and patient instructed on action plan if red flags develop.   They have been instructed that if symptoms worsen or red flags develop they should return to Urgent Care, go to the nearest ED, or activate EMS/911.     Patient  and/or parent/guardian (if applicable) agreed with plan and voiced understanding.  No barriers to adherence perceived by myself.   Portions of this note may have been dictated using Dragon dictation software/hardware and may contain grammatical or spelling errors.   Electronically signed by @ELECTRONICSIG @ at 9:41 AM.  If a new prescription was given today, then I discussed potential side effects, drug interactions, instructions for taking the medication, and the consequences of not taking it.    F/u: Follow up closely with primary care provider (PCP) and other specialists for further care and routine care, but seek medical attention sooner if worsening/concerning signs or symptoms.  Home Care   Electronically signed by: Darryle Slater Fish, PA-C 05/11/2023 9:41 AM

## 2023-05-24 NOTE — Progress Notes (Signed)
 [address] - AMBULATORY [facility] [address] [place] [address]   History of Present Illness  Patient ID: Nancy Shaw is a 23 y.o. female.  History of Present Illness Nancy Shaw is a 23 y.o. female who complains of pain of the right foot that began over the past week. No known injury. Patient does work on feet daily for extended periods of time. There is generalized foot pain. She endorses swelling but cannot localize it in room. The patient is able to bear weight directly after the injury.  Treatment to date: Ibuprofen once.  Denies numbness or tingling.    Parts of patient history reviewed include PMH, problem list, medications, allergies, and social history. Past Medical History:  Diagnosis Date  . Body mass index, pediatric, 5th percentile to less than 85th percentile for age    BMI,pediatric 5% - <85%  . Coronavirus infection Resolved: [id number]   Coronavirus infection  . Influenza    INFLUENZA  . POTS (postural orthostatic tachycardia syndrome)   . Routine infant or child health check    ROUTINE INFANT OR CHILD HEALTH CHECK  . Uses contraception    Family planning    Vitals   Vitals:   05/24/23 1257  BP: 117/70  BP Location: Left arm  Pulse: 80  Resp: 18  Temp: 98.6 F (37 C)  TempSrc: Tympanic  SpO2: 100%  Weight: 76.7 kg (169 lb)     Physicial Exam  Physical Exam Vitals and nursing note reviewed.  Constitutional:      General: She is not in acute distress.    Appearance: Normal appearance. She is normal weight. She is not ill-appearing.  HENT:     Head: Normocephalic and atraumatic.  Eyes:     Extraocular Movements: Extraocular movements intact.  Cardiovascular:     Rate and Rhythm: Normal rate and regular rhythm.  Pulmonary:     Effort: Pulmonary effort is normal.     Breath sounds: Normal breath sounds.  Musculoskeletal:         General: Normal range of motion.     Right ankle: No swelling or deformity. Tenderness (Generalized) present. Normal range of motion.     Right Achilles Tendon: Normal.     Left ankle: Normal.     Left Achilles Tendon: Normal.     Right foot: Normal range of motion. Tenderness present. No swelling or bony tenderness.     Left foot: Normal.  Skin:    General: Skin is warm.     Capillary Refill: Capillary refill takes less than 2 seconds.     Findings: No rash.  Neurological:     General: No focal deficit present.     Mental Status: She is alert and oriented to person, place, and time. Mental status is at baseline.      Labs   No results found for this or any previous visit (from the past 24 hours).   Imaging   No orders to display    Diagnosis  Nancy Shaw was seen today for ankle pain.  Diagnoses and all orders for this visit:  Sprain of right foot, initial encounter   MDM  Pt is a 23 y.o. female with no pertinent PMHX who presents w/ right foot pain. Patient has been able to ambulate. Patient has generalized tenderness over foot. Neurovascularly intact. No obvious deformity. No swelling, redness or bruising noted. She has FROM. History and  reassuring exam doubt acute fracture, misalignment or dislocation.  Most likely sprain of right foot.  Discussed diagnoses and management with patient. Discharged home in stable condition. Ankle wrapped. Patient advised to RICE affected limb.  Encouraged ibuprofen. Patient to follow-up at PCP. Strict return precautions given.    Urgent Care Disposition:  Follow up with PCP Discharge Information    If a new prescription was given today, then I discussed potential side effects, drug interactions, instructions for taking the medication, and the consequences of not taking it.     F/u: Follow up closely with primary care provider (PCP) and other specialists for further care and routine care, but seek medical attention sooner if worsening/concerning  signs or symptoms. Strict return precautions reviewed with parent(s).   Electronically signed by: Denver Hands, PA-C 05/24/2023 1:45 PM

## 2023-07-01 NOTE — Progress Notes (Signed)
 Quintina Carlye Panameno is a 23 y.o.  with  reports that she has never smoked. She has never been exposed to tobacco smoke. She has never used smokeless tobacco. with  Active Ambulatory Problems    Diagnosis Date Noted  . No Active Ambulatory Problems   Resolved Ambulatory Problems    Diagnosis Date Noted  . No Resolved Ambulatory Problems   Past Medical History:  Diagnosis Date  . Body mass index, pediatric, 5th percentile to less than 85th percentile for age   . Coronavirus infection Resolved: [id number]  . Influenza   . POTS (postural orthostatic tachycardia syndrome)   . Routine infant or child health check   . Uses contraception    who presents today at Urgent Care for  Chief Complaint  Patient presents with  . Sore Throat    Patient reports sore throat since yesterday. Patient is requesting a work note for today.        History of Present Illness This is a 23 year old female with a history of Postural Orthostatic Tachycardia Syndrome (POTS) presenting with a sore throat since yesterday. She arrived by car.  The patient reports the onset of a sore throat yesterday. She was sent home from work and is seeking confirmation that she can return in 2 days. She describes a history of strep throat and expresses concern about the possibility of a recurrence. She also mentions that her nurse suggested a referral to a primary care physician due to her lifelong condition of POTS. She is currently seeking a primary care provider.      Patient has no co-morbidities  or medications that might increase the risk for developing a severe infection     reports that she has never smoked. She has never been exposed to tobacco smoke. She has never used smokeless tobacco.  Review of Systems  Review of systems is otherwise negative except as noted in the HPI and Assessment/MDM   Physical Exam  BP 124/69 (BP Location: Left arm, Patient Position: Sitting)   Pulse 90   Temp 98.4 F (36.9 C)  (Tympanic)   Resp 16   Ht 1.575 m (5' 2)   Wt 76.7 kg (169 lb)   SpO2 100%   BMI 30.91 kg/m   Constitutional:      General: Patient is not in acute distress.    Appearance: Normal appearance.  Neurological:     General: No focal deficit present.     Mental Status: alert and oriented to person, place, and time.  HENT:     Eyes:     Conjunctiva/sclera: Conjunctivae normal. Pharynx mild erythema    There is no associated anterior cervical lymphadenopathy    Pupils: Pupils are equal, round, and reactive to light.     TM's normal with no visible effusion  Cardiovascular:     Heart sounds: Normal heart sounds. No murmur heard.    No Lower extremity edema noted Pulmonary:     Effort: Pulmonary effort is normal. No respiratory distress.     Breath sounds: No wheezing, rhonchi or rales.  Abdominal:     General: Bowel sounds are normal. There is no distension.     Tenderness: There is no abdominal tenderness.  Musculoskeletal:        General: Normal range of motion.     Neck:  No rigidity.  Skin:    General: Skin is warm and dry.  Psychiatric:        Mood and Affect: Mood normal.  No orders to display            DIAGNOSIS/PLAN     1. Sore throat  POC Rapid Strep A   loratadine-pseudoePHEDrine (CLARITIN-D 12-hour) 5-120 mg per 12 hr tablet   lidocaine (Lidocaine Viscous) 2 % viscous solution    2. Postural hypotension  Ambulatory referral to [facility]      Assessment & Plan Initial Assessment: 23 year old female presenting with sore throat since yesterday. History of POTS.  Differential Diagnosis: - Streptococcal pharyngitis: Sore throat, spots on the back of the throat. Strep test to rule out. - Viral pharyngitis: Sore throat, no significant findings on examination. Consider if strep test negative.  ED Course: - Swabbed throat for strep test. - Referral to primary care physician provided.  Final Assessment: Sore throat evaluated with strep test.  Referral for POTS management.  Clinical Impression: - Pharyngitis - Postural Orthostatic Tachycardia Syndrome (POTS)  Disposition: - Discharge: Home, pending strep test results. Work note provided for absence today. - Follow-Up: Referral to primary care physician for ongoing management of POTS.  PROCEDURE Indication: Sore throat with visible spots on the back of the throat. Consent: Consent obtained from the patient. Procedure: Throat swab performed to test for strep throat.   Portions of this note were created using the aid of voice recognition Dragon/DAX dictation software.   We discussed risks and side effects of medications, and also discussed red flags which would warrant immediate follow-up.   Urgent Care Disposition:  Home Care

## 2023-08-22 NOTE — Progress Notes (Signed)
 [facility] Patient Information  Location Information: Patient State (at time of visit): [place]  Patient Location (at time of visit):Home/Other Non-Medical  Provider Location: Home Is provider licensed to provide clinical care in the current location/state of the patient? Yes  Consent  Patient's identity was confirmed. Presenting condition or illness was discussed with the patient/personal representative. Current proposed treatment for presenting condition or illness was explained to patient/personal representative along with the likely benefits and any significant risks or complications associated with the provision of treatment by audio/video means. The patient/personal representative verbally authorized treatment to be provided by audio/video, which may include a limited review of patient's current health status, medication, or other treatment recommendations, patient education, and an opportunity to ask questions about condition and treatment. Verbal Consent Granted by Patient/Personal Representative:Yes   Visit Information: Modality: 2-Way Real-Time Audio/Video  Video Visit Start Time/ End Time:  Start time: 08/22/2023 12:12 PM EDT End time: 08/22/2023 12:23 PM EDT  Video Total Time: 58m 50s  History of Present Illness  Nancy Shaw presents due to concerns for rash.   Rash This is a recurrent problem. The current episode started more than 1 month ago (Since March 2025). The problem has been waxing and waning since onset. The affected locations include the back. The rash is characterized by itchiness and redness. It is unknown if there was an exposure to a precipitant. Pertinent negatives include no anorexia, congestion, cough, diarrhea, eye pain, facial edema, fatigue, fever, joint pain, nail changes, rhinorrhea, shortness of breath, sore throat or vomiting. Past treatments include antibiotics and antibiotic cream. The treatment provided no relief.      Video Exam  Physical Exam Constitutional:      Appearance: Normal appearance.  HENT:     Right Ear: External ear normal.     Left Ear: External ear normal.     Nose: Nose normal. No rhinorrhea.   Eyes:     General:        Right eye: No discharge.        Left eye: No discharge.     Extraocular Movements: Extraocular movements intact.     Conjunctiva/sclera: Conjunctivae normal.   Pulmonary:     Effort: Pulmonary effort is normal. No respiratory distress.     Breath sounds: No stridor.     Comments: Able to speak in fluid sentences with no gasping.  No audible wheeze  Skin:        Comments: Two lesions with surrounding redness and raised border. Skin with hyperpigmentation present. No drainage present.    Neurological:     General: No focal deficit present.     Mental Status: She is alert.   Psychiatric:        Mood and Affect: Mood normal.        Behavior: Behavior normal.        Thought Content: Thought content normal.     Diagnosis, Medical Decision Making & Disposition  Assessment/ Plan 1. Tinea corporis (Primary) - terbinafine (LamISIL) 250 mg tablet; Take 1 tablet (250 mg total) by mouth daily for 14 days.  Dispense: 14 tablet; Refill: 0 - clotrimazole -betamethasone (LOTRISONE) 1-0.05 % cream; Apply 1 Application topically 2 (two) times a day for 14 days.  Dispense: 45 g; Refill: 0  Symptoms consistent with tinea rash. Patient is to begin on oral terbinafine and Lotrisone cream.  Recommended using for a maximum of 2 weeks. If no improvement in that time, need to be seen in  person.    Other recommendations-   -After touching the area with the rash, wash your hands before touching another area of your body.   -Keep the infected area clean and dry. The fungus that causes tinea thrives in warm, moist areas, so you want keep the area clean and dry.   -When cleaning the area with the tinea rash, wash the affected area(s) and dry it (them) with a clean towel. Use  another clean towel to dry the other parts of your body.   -Before using these towels again, wash them in hot, sudsy water.   -To keep the area dry, avoid wearing clothes, socks, and shoes that make you sweat.   -Change your clothes, including underwear and socks, every day. Wash the clothes before wearing them again. This includes clothes you wear to work out.   Education officer, museum after working out. Fungi thrive in moist, warm areas. You want to wash away perspiration and keep the area dry.   -Avoid sharing towels and other personal items. You can easily spread fungi to others by sharing towels, hats, combs, and other personal items. The fungi can survive on objects for a long time.   -If you have athlete's foot, wear shower thongs or waterproof shoes in locker rooms, showers that others use, and pool areas. This helps prevent spreading it to others. It also gives you some protection if someone else has a fungal infection.   -Disinfect or throw out infected items.   -To avoid re-infecting yourself with infected items, you should wash clothes, towels, and bedding that you use while you have the fungal rash. Be sure to wash everything in hot, soapy water.   For questions or concerns regarding this visit, patient should contact Virtual Support at [phone].  Disposition:  Patient to continue care at home.  Electronically signed: Leotis Shu Fleurismond, FNP 08/22/2023  12:12 PM

## 2023-09-30 NOTE — Progress Notes (Signed)
 Location Information: Patient State (at time of visit): [place]  Patient Location (at time of visit):Home/Other Non-Medical  Provider Location: Home Is provider licensed to provide clinical care in the current location/state of the patient? Yes   Consent:  Patient's identity was confirmed. Presenting condition or illness was discussed with the patient/personal representative. Current proposed treatment for presenting condition or illness was explained to patient/personal representative along with the likely benefits and any significant risks or complications associated with the provision of treatment by audio/video means. The patient/personal representative verbally authorized treatment to be provided by audio/video, which may include a limited review of patient's current health status, medication, or other treatment recommendations, patient education, and an opportunity to ask questions about condition and treatment. Verbal Consent Granted by Patient/Personal Representative: Yes  Visit Information:   Start time: 09/30/2023 11:04 AM EDT End time: 09/30/2023 11:06 AM EDT  Video Time: 57m 10s   Conjunctivitis  The current episode started today. The onset was gradual. The problem occurs rarely. The problem has been unchanged. The problem is mild. Nothing relieves the symptoms. Nothing aggravates the symptoms. Associated symptoms include eye itching and eye redness. Pertinent negatives include no decreased vision, no double vision, no photophobia, no eye discharge and no eye pain.     Physical Exam Constitutional:      General: She is not in acute distress.    Appearance: Normal appearance. She is not ill-appearing, toxic-appearing or diaphoretic.  HENT:     Mouth/Throat:     Mouth: Mucous membranes are moist.     Comments: Normal phonation Eyes:     Conjunctiva/sclera:     Right eye: Right conjunctiva is not injected. No chemosis or exudate.    Left eye: Left conjunctiva is  injected. No chemosis or exudate. Pulmonary:     Effort: Pulmonary effort is normal. No respiratory distress.     Breath sounds: No stridor.     Comments: Patient able to talk in complete sentences without difficulty  Musculoskeletal:     Cervical back: Normal range of motion.  Skin:    Coloration: Skin is not pale.  Neurological:     Mental Status: She is alert and oriented to person, place, and time.  Psychiatric:        Behavior: Behavior normal.        Thought Content: Thought content normal.        Judgment: Judgment normal.     Assessment/ Plan  Diagnoses and all orders for this visit:  Acute conjunctivitis of left eye, unspecified acute conjunctivitis type  Other orders -     medroxyPROGESTERone (DEPO-PROVERA) 150 mg/mL injection; Inject 150 mg every 3 months by intramuscular route. -     moxifloxacin (VIGAMOX) 0.5 % ophthalmic solution; Administer 1 drop into left eye 3 (three) times a day for 7 days.    23 year old female seen today for acute symptoms of left eye irritation.  Patient states that she noted her left eye became red this morning and it has some itchiness.  States that she previously started wearing her contacts again 2 days ago.  Has not noted any drainage.  Will treat empirically at this time with moxifloxacin ophthalmic solution.  Recommend cool compress as well as proper hand hygiene.  Follow up virtually or in-person as needed   For questions or concerns regarding this visit, patient should contact Virtual Support at [phone].  Disposition:  Patient to continue care at home.  Electronically signed: Fairy Ozell Batter, PA-C 09/30/2023  11:09  AM

## 2023-10-01 ENCOUNTER — Other Ambulatory Visit: Payer: Self-pay

## 2023-10-01 ENCOUNTER — Emergency Department (HOSPITAL_COMMUNITY)
Admission: EM | Admit: 2023-10-01 | Discharge: 2023-10-01 | Disposition: A | Discharge status: Home / Self Care | Attending: Emergency Medicine | Admitting: Emergency Medicine

## 2023-10-01 DIAGNOSIS — I10 Essential (primary) hypertension: Secondary | ICD-10-CM | POA: Diagnosis not present

## 2023-10-01 DIAGNOSIS — B354 Tinea corporis: Secondary | ICD-10-CM | POA: Insufficient documentation

## 2023-10-01 DIAGNOSIS — R21 Rash and other nonspecific skin eruption: Secondary | ICD-10-CM

## 2023-10-01 LAB — CBG MONITORING, ED: Glucose-Capillary: 93 mg/dL (ref 70–99)

## 2023-10-01 MED ORDER — DIPHENHYDRAMINE HCL 50 MG/ML IJ SOLN
25.0000 mg | Freq: Once | INTRAMUSCULAR | Status: AC
  Administered 2023-10-01: 25 mg via INTRAMUSCULAR
  Filled 2023-10-01: qty 1

## 2023-10-01 MED ORDER — CLOTRIMAZOLE 1 % EX CREA: TOPICAL_CREAM | CUTANEOUS | 0 refills | Status: AC

## 2023-10-01 NOTE — Discharge Instructions (Addendum)
 Clotrimazole  cream: Apply to affected and surrounding area(s) twice daily until clinical resolution, typically 1 to 4 weeks. It is important that you follow up with dermatology and primary care. Seek emergency care if experiencing any new or worsening symptoms.  Please also take Benadryl  for diffuse itching.

## 2023-10-01 NOTE — ED Provider Notes (Signed)
 [facility] Provider Note   CSN: [id number] Arrival date & time: 10/01/23  9381     Patient presents with: No chief complaint on file.   Nancy Shaw is a 23 y.o. female with PMHx HTN, migraines, POTS who presents to ED concerned for recurrent rash x6 months. Patient stating that she has been on steroids, ABX, and anti-fungal medications for this rash, and while the dark spot has not ever fully healed, the surrounding erythema and itching has improved. Patient noting most improvement when she is on antifungals. Patient stating that rash flared back up again last night after a shower. Patient then got back into the shower to make sure all the soap was washed off which did help her symptoms. Patient then woke up this morning with itching around the rash, applied anti-fungal cream which did not resolve the itching, so she came to ED. Patient has not tried any other OTC medications for itching recently. LMP 3 weeks ago.   Denies fever, chest pain, dyspnea, cough, nausea, vomiting, diarrhea, dysuria, hematuria. Denies bug/tick bite.     HPI     Prior to Admission medications   Medication Sig Start Date End Date Taking? Authorizing Provider  clotrimazole  (LOTRIMIN ) 1 % cream Apply to affected and surrounding area(s) twice daily until clinical resolution, typically 1 to 4 weeks 10/01/23  Yes Hoy Nidia FALCON, PA-C  cefdinir  (OMNICEF ) 300 MG capsule Take 1 capsule (300 mg total) by mouth 2 (two) times daily. 06/10/22   Raspet, Erin K, PA-C  cetirizine (ZYRTEC) 10 MG tablet Take 10 mg by mouth daily.    [provider]  medroxyPROGESTERone (DEPO-PROVERA) 150 MG/ML injection Inject 150 mg into the muscle every 3 (three) months.    [provider]  methocarbamol  (ROBAXIN ) 500 MG tablet Take 1 tablet (500 mg total) by mouth 2 (two) times daily as needed for muscle spasms. Patient not taking: Reported on 05/26/2022 08/31/20   Patel,  Shalyn, PA-C  metroNIDAZOLE  (FLAGYL ) 500 MG tablet Take 1 tablet (500 mg total) by mouth 2 (two) times daily. 06/12/22   Lamptey, Aleene KIDD, MD  Pseudoephedrine-APAP-DM (DAYQUIL MULTI-SYMPTOM COLD/FLU PO) Take 1 capsule by mouth daily as needed (sinus).    [provider]    Allergies: Patient has no known allergies.    Review of Systems  Skin:  Positive for rash.    Updated Vital Signs BP (!) 147/88 (BP Location: Right Arm)   Pulse 85   Temp 98.5 F (36.9 C) (Oral)   Resp 16   SpO2 99%   Physical Exam Vitals and nursing note reviewed.  Constitutional:      General: She is not in acute distress.    Appearance: She is not ill-appearing or toxic-appearing.  HENT:     Head: Normocephalic and atraumatic.     Mouth/Throat:     Mouth: Mucous membranes are moist.  Eyes:     General: No scleral icterus.       Right eye: No discharge.        Left eye: No discharge.     Conjunctiva/sclera: Conjunctivae normal.  Cardiovascular:     Rate and Rhythm: Normal rate.     Pulses: Normal pulses.  Pulmonary:     Effort: Pulmonary effort is normal.  Abdominal:     General: Abdomen is flat.  Musculoskeletal:     Right lower leg: No edema.     Left lower leg: No edema.  Skin:  General: Skin is warm and dry.     Findings: No rash.     Comments: Velvety appearance of dark spots on patient's right thoracic spine. There is also a small spot appearing on base of posterior neck. No fluctuance.   Neurological:     General: No focal deficit present.     Mental Status: She is alert. Mental status is at baseline.  Psychiatric:        Mood and Affect: Mood normal.     (all labs ordered are listed, but only abnormal results are displayed) Labs Reviewed  CBG MONITORING, ED    EKG: None  Radiology: No results found.   Procedures   Medications Ordered in the ED  diphenhydrAMINE  (BENADRYL ) injection 25 mg (has no administration in time range)                                     Medical Decision Making Risk Prescription drug management.   This patient presents to the ED for concern of rash, this involves an extensive number of treatment options, and is a complaint that carries with it a high risk of complications and morbidity.  The differential diagnosis includes irritant contact dermatitis, DRESS, atopic dermatitis, anaphylaxis, SJS/TEN   Co morbidities that complicate the patient evaluation  HTN, migraines, POTS   Additional history obtained:  Additional history obtained from multiple PCP/UC notes over the past 6 months as patient repeatedly presents for concern of rash - prescribed anti-fungal, ABX, and steroid cream treatments.    Problem List / ED Course / Critical interventions / Medication management  Patient presents ED concern for recurrent rash over the past 6 months.  Patient has been on antifungals, steroids, and antibiotics without full resolution of her rash although the symptoms do get better.  Patient noting best improvement with antifungal treatment.  Patient not adhering to antifungal cream treatment as she is only applying it intermittently whenever itching is severe, but I do suspect that this is the right treatment. Patient stating that she did not establish an appointment with dermatology because they could not see her for months. I educated patient on appropriate use of anti-fungal cream, OTC medications for itching, and the importance of following up with dermatology and possibly putting name on cancellation list to get an appointment in a more timely manner. Patient verbalized understanding of plan. CBG 96. Staffed with Dr. Bernard. I have reviewed the patients home medicines and have made adjustments as needed The patient has been appropriately medically screened and/or stabilized in the ED. I have low suspicion for any other emergent medical condition which would require further screening, evaluation or treatment in the ED or require  inpatient management. At time of discharge the patient is hemodynamically stable and in no acute distress. I have discussed work-up results and diagnosis with patient and answered all questions. Patient is agreeable with discharge plan. We discussed strict return precautions for returning to the emergency department and they verbalized understanding.     Social Determinants of Health:  none       Final diagnoses:  Rash  Tinea corporis    ED Discharge Orders          Ordered    clotrimazole  (LOTRIMIN ) 1 % cream        10/01/23 0755               Hoy Nidia FALCON, [place] 10/01/23 [phone]  Bernard Drivers, MD 10/01/23 [phone]

## 2023-10-01 NOTE — ED Triage Notes (Signed)
 Pt says that she has had these two areas on her right mid back, itchy area that come and go 6 months, has been evaluated for the same for them prior. Reports the areas are larger and more red around them. She says that she feels like she itches all over this morning. She applied cream this morning without relief.

## 2023-11-11 ENCOUNTER — Ambulatory Visit
Admission: EM | Admit: 2023-11-11 | Discharge: 2023-11-11 | Disposition: A | Discharge status: Home / Self Care | Attending: Family Medicine | Admitting: Family Medicine

## 2023-11-11 DIAGNOSIS — R42 Dizziness and giddiness: Secondary | ICD-10-CM | POA: Diagnosis not present

## 2023-11-11 DIAGNOSIS — G90A Postural orthostatic tachycardia syndrome (POTS): Secondary | ICD-10-CM | POA: Diagnosis not present

## 2023-11-11 DIAGNOSIS — R55 Syncope and collapse: Secondary | ICD-10-CM

## 2023-11-11 NOTE — ED Provider Notes (Signed)
 [facility]  Note:  This document was prepared using Conservation officer, historic buildings and may include unintentional dictation errors.  MRN: [id number] DOB: [birth year]  Subjective:   Nancy Shaw is a 23 y.o. female presenting for an episode of dizziness, lightheadedness and syncope with collapse earlier today ~13:00-14:00.  Patient reports that she was standing over a pile of her laundry and thankfully ended up collapsing onto that.  Did not suffer a head injury.  Denies headache, confusion, chest pain, shortness of breath, heart racing, palpitations, nausea, vomiting, weakness, numbness or tingling, abdominal pain.  Patient does have a history of POTS.  She tries to manage this as best she can and has not had a similar episode in some time.  She was recently restarted on her Depo injections.  It was causing side effects and therefore her PCP also sent in estradiol for her.  She has not started this.  No history of neurologic conditions, seizures, clotting disorders, anemia.  Patient tries to hydrate very well and eat regular meals so as to avoid complications with her POTS.  No current facility-administered medications for this encounter.  Current Outpatient Medications:    cefdinir  (OMNICEF ) 300 MG capsule, Take 1 capsule (300 mg total) by mouth 2 (two) times daily., Disp: 14 capsule, Rfl: 0   cetirizine (ZYRTEC) 10 MG tablet, Take 10 mg by mouth daily., Disp: , Rfl:    clotrimazole  (LOTRIMIN ) 1 % cream, Apply to affected and surrounding area(s) twice daily until clinical resolution, typically 1 to 4 weeks, Disp: 15 g, Rfl: 0   medroxyPROGESTERone (DEPO-PROVERA) 150 MG/ML injection, Inject 150 mg into the muscle every 3 (three) months., Disp: , Rfl:    methocarbamol  (ROBAXIN ) 500 MG tablet, Take 1 tablet (500 mg total) by mouth 2 (two) times daily as needed for muscle spasms. (Patient not taking: Reported on 05/26/2022), Disp: 10 tablet, Rfl: 0   metroNIDAZOLE  (FLAGYL )  500 MG tablet, Take 1 tablet (500 mg total) by mouth 2 (two) times daily., Disp: 14 tablet, Rfl: 0   Pseudoephedrine-APAP-DM (DAYQUIL MULTI-SYMPTOM COLD/FLU PO), Take 1 capsule by mouth daily as needed (sinus)., Disp: , Rfl:    Allergies  Allergen Reactions   Pollen Extract     Past Medical History:  Diagnosis Date   Fatigue    Hypertension    Migraines    POTS (postural orthostatic tachycardia syndrome)      Past Surgical History:  Procedure Laterality Date   HERNIA REPAIR     TONSILLECTOMY      History reviewed. No pertinent family history.  Social History   Tobacco Use   Smoking status: Never   Smokeless tobacco: Never  Vaping Use   Vaping status: Never Used  Substance Use Topics   Alcohol use: Yes    Comment: Occa   Drug use: Never    ROS   Objective:   Vitals: BP 117/83 (BP Location: Left Arm)   Pulse 87   Temp 99.4 F (37.4 C) (Oral)   Resp 16   SpO2 98%   Physical Exam Constitutional:      General: She is not in acute distress.    Appearance: Normal appearance. She is well-developed. She is not ill-appearing, toxic-appearing or diaphoretic.  HENT:     Head: Normocephalic and atraumatic.     Right Ear: External ear normal.     Left Ear: External ear normal.     Nose: Nose normal.     Mouth/Throat:  Mouth: Mucous membranes are moist.     Pharynx: No pharyngeal swelling, oropharyngeal exudate, posterior oropharyngeal erythema or uvula swelling.     Tonsils: No tonsillar exudate or tonsillar abscesses. 0 on the right. 0 on the left.  Eyes:     General: No scleral icterus.       Right eye: No discharge.        Left eye: No discharge.     Extraocular Movements: Extraocular movements intact.  Cardiovascular:     Rate and Rhythm: Normal rate and regular rhythm.     Heart sounds: Normal heart sounds. No murmur heard.    No friction rub. No gallop.  Pulmonary:     Effort: Pulmonary effort is normal. No respiratory distress.     Breath sounds:  No stridor. No wheezing, rhonchi or rales.  Chest:     Chest wall: No tenderness.  Skin:    General: Skin is warm and dry.  Neurological:     General: No focal deficit present.     Mental Status: She is alert and oriented to person, place, and time.     Cranial Nerves: No cranial nerve deficit.     Motor: No weakness.     Coordination: Coordination normal.     Gait: Gait normal.     Comments: No facial asymmetry.  Psychiatric:        Mood and Affect: Mood normal.        Behavior: Behavior normal.        Thought Content: Thought content normal.        Judgment: Judgment normal.    ED ECG REPORT   Date: 11/11/2023  EKG Time: 6:26 PM  Rate: 64bpm  Rhythm: normal sinus rhythm  Axis: Normal  Intervals:none  ST&T Change: T wave flattening in lead III  Narrative Interpretation: Sinus rhythm at 64 bpm with nonspecific T wave changes above.  Comparable, improved from previous EKGs.   Assessment and Plan :   PDMP not reviewed this encounter.  1. Dizziness   2. Syncope and collapse   3. POTS (postural orthostatic tachycardia syndrome)    Reassuring physical exam findings, cardiopulmonary and neurologic exam.  Has hemodynamically stable vital signs.  Emphasized urgent follow-up with her PCP and cardiologist or presenting to the emergency room if she has another episode.  Otherwise, maintain health maintenance and measures to avoid POTS.    Christopher Savannah, [place] 11/12/23 [phone]

## 2023-11-11 NOTE — ED Triage Notes (Signed)
 Pt reports she took a nap around 1300-1400 today and went to the bathroom, she felt shaky and she passed out, pt was alone, she doesn't know fro how long she passed out. Pt reports she's being spotting and feeling weak x 2 weeks after birth control shot and PCP sent estradiol today to help with symptoms, pt was not abel to pick up.
